# Patient Record
Sex: Female | Born: 2005 | Race: White | Hispanic: No | Marital: Single | Smoking: Never smoker
Health system: Southern US, Community
[De-identification: ages and names within clinical notes are randomized; demographics above are authoritative.]

## PROBLEM LIST (undated history)

## (undated) DIAGNOSIS — Q680 Congenital deformity of sternocleidomastoid muscle: Secondary | ICD-10-CM

## (undated) DIAGNOSIS — H6690 Otitis media, unspecified, unspecified ear: Secondary | ICD-10-CM

## (undated) HISTORY — DX: Otitis media, unspecified, unspecified ear: H66.90

## (undated) HISTORY — PX: WISDOM TOOTH EXTRACTION: SHX21

## (undated) HISTORY — DX: Congenital deformity of sternocleidomastoid muscle: Q68.0

---
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
## 2025-04-17 ENCOUNTER — Encounter: Admitting: Family Medicine
